# Patient Record
Sex: Male | Born: 1990 | Race: Black or African American | Hispanic: No | Marital: Single | Smoking: Never smoker
Health system: Southern US, Community
[De-identification: ages and names within clinical notes are randomized; demographics above are authoritative.]

---

## 2018-09-07 ENCOUNTER — Emergency Department (HOSPITAL_COMMUNITY)
Admission: EM | Admit: 2018-09-07 | Discharge: 2018-09-07 | Disposition: A | Discharge status: Home / Self Care | Payer: Self-pay | Attending: Emergency Medicine | Admitting: Emergency Medicine

## 2018-09-07 ENCOUNTER — Encounter (HOSPITAL_COMMUNITY): Payer: Self-pay | Admitting: *Deleted

## 2018-09-07 ENCOUNTER — Other Ambulatory Visit: Payer: Self-pay

## 2018-09-07 DIAGNOSIS — R300 Dysuria: Secondary | ICD-10-CM | POA: Insufficient documentation

## 2018-09-07 DIAGNOSIS — R0981 Nasal congestion: Secondary | ICD-10-CM | POA: Insufficient documentation

## 2018-09-07 LAB — URINALYSIS, ROUTINE W REFLEX MICROSCOPIC
Bilirubin Urine: NEGATIVE
Glucose, UA: NEGATIVE mg/dL
Hgb urine dipstick: NEGATIVE
Ketones, ur: NEGATIVE mg/dL
Leukocytes, UA: NEGATIVE
Nitrite: NEGATIVE
Protein, ur: NEGATIVE mg/dL
Specific Gravity, Urine: 1.023 (ref 1.005–1.030)
pH: 6 (ref 5.0–8.0)

## 2018-09-07 LAB — HIV ANTIBODY (ROUTINE TESTING W REFLEX): HIV Screen 4th Generation wRfx: NONREACTIVE

## 2018-09-07 MED ORDER — FLUTICASONE PROPIONATE 50 MCG/ACT NA SUSP: 2.0000 | Freq: Every day | NASAL | 2 refills | Status: DC

## 2018-09-07 MED ORDER — CETIRIZINE HCL 10 MG PO TABS: 10.0000 mg | ORAL_TABLET | Freq: Every day | ORAL | 1 refills | Status: DC

## 2018-09-07 NOTE — Discharge Instructions (Addendum)
There were no abnormalities noted on the urinalysis. Hand washing: Wash your hands throughout the day, but especially before and after touching the face, using the restroom, sneezing, coughing, or touching surfaces that have been coughed or sneezed upon. Hydration: Symptoms will be intensified and complicated by dehydration. Dehydration can also extend the duration of symptoms. Drink plenty of fluids and get plenty of rest. You should be drinking at least half a liter of water an hour to stay hydrated. Electrolyte drinks (ex. Gatorade, Powerade, Pedialyte) are also encouraged. You should be drinking enough fluids to make your urine light yellow, almost clear. If this is not the case, you are not drinking enough water. Please note that some of the treatments indicated below will not be effective if you are not adequately hydrated. Pain or fever: Ibuprofen, Naproxen, or acetaminophen (generic for Tylenol) for pain or fever.  Zyrtec or Claritin: May add these medication daily to control underlying symptoms of congestion, sneezing, and other signs of allergies.  These medications are available over-the-counter. Generics: Cetirizine (generic for Zyrtec) and loratadine (generic for Claritin). Fluticasone: Use fluticasone (generic for Flonase), as directed, for nasal and sinus congestion.  This medication is available over-the-counter. Congestion: Plain guaifenesin (generic for plain Mucinex) may help relieve congestion. Saline sinus rinses and saline nasal sprays may also help relieve congestion. If you do not have high blood pressure, heart problems, or an allergy to such medications, you may also try phenylephrine or Sudafed. Sore throat: Warm liquids or Chloraseptic spray may help soothe a sore throat. Gargle twice a day with a salt water solution made from a half teaspoon of salt in a cup of warm water.  Follow up: Follow up with a primary care provider within the next two weeks should symptoms fail to  resolve. Return: Return to the ED for significantly worsening symptoms, abdominal pain, shortness of breath, persistent vomiting, or any other major concerns.  You have been tested for STDs. Some of these results are still pending. Any abnormalities will be called to you. Be sure to follow safe sex practices, including monogamy and/or condom use. Any future STD testing or treatment should be performed at the [facility] or primary care office. Should symptoms fail to improve within 1 week, follow up with a primary care provider or go to the [facility].  For prescription assistance, may try using prescription discount sites or apps, such as goodrx.com

## 2018-09-07 NOTE — ED Triage Notes (Signed)
The pt wants to be seen for a sinus infection that he has had for one month  He has other problems that he wants to talk to the doctor about
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
## 2018-09-08 LAB — GC/CHLAMYDIA PROBE AMP (~~LOC~~) NOT AT ARMC
Chlamydia: NEGATIVE
NEISSERIA GONORRHEA: NEGATIVE

## 2018-09-08 LAB — URINE CULTURE: Culture: NO GROWTH

## 2018-09-08 LAB — RPR, QUANT+TP ABS (REFLEX)
Rapid Plasma Reagin, Quant: 1:2 {titer} — ABNORMAL HIGH
T Pallidum Abs: REACTIVE — AB

## 2018-09-08 LAB — SYPHILIS: RPR W/REFLEX TO RPR TITER AND TREPONEMAL ANTIBODIES, TRADITIONAL SCREENING AND DIAGNOSIS ALGORITHM: RPR Ser Ql: REACTIVE — AB

## 2019-01-06 ENCOUNTER — Other Ambulatory Visit: Payer: Self-pay

## 2019-01-06 ENCOUNTER — Emergency Department (HOSPITAL_COMMUNITY)
Admission: EM | Admit: 2019-01-06 | Discharge: 2019-01-06 | Disposition: A | Discharge status: Home / Self Care | Payer: Self-pay | Attending: Emergency Medicine | Admitting: Emergency Medicine

## 2019-01-06 DIAGNOSIS — R21 Rash and other nonspecific skin eruption: Secondary | ICD-10-CM | POA: Insufficient documentation

## 2019-01-06 DIAGNOSIS — Z79899 Other long term (current) drug therapy: Secondary | ICD-10-CM | POA: Insufficient documentation

## 2019-01-06 MED ORDER — PERMETHRIN 5 % EX CREA: TOPICAL_CREAM | CUTANEOUS | 0 refills | Status: DC

## 2019-01-06 NOTE — ED Provider Notes (Signed)
TIME SEEN: 1:49 AM  CHIEF COMPLAINT: Rash  HPI: Patient is a 28 year old male with no significant past medical history who presents to the emergency department for a pruritic rash to his extremities and torso over the past several days.  No rash on his palms, soles or mucous membranes.  No fever.  No new exposures.  Has been seen by his PCP as he was concerned this could be related to an STD and states he had a negative syphilis test.  States his doctor told him he would "look into it".  He has not tried any over-the-counter medications.  States it was itching tonight when he was coming home from work so he decided to stop by the emergency department to "get it checked out".  ROS: See HPI Constitutional: no fever  Eyes: no drainage  ENT: no runny nose   Cardiovascular:  no chest pain  Resp: no SOB  GI: no vomiting GU: no dysuria Integumentary: no rash  Allergy: no hives  Musculoskeletal: no leg swelling  Neurological: no slurred speech ROS otherwise negative  PAST MEDICAL HISTORY/PAST SURGICAL HISTORY:  No past medical history on file.  MEDICATIONS:  Prior to Admission medications   Medication Sig Start Date End Date Taking? Authorizing Provider  cetirizine (ZYRTEC ALLERGY) 10 MG tablet Take 1 tablet (10 mg total) by mouth daily. 09/07/18 11/06/18  Joy, Shawn C, PA-C  fluticasone (FLONASE) 50 MCG/ACT nasal spray Place 2 sprays into both nostrils daily. 09/07/18   Joy, Shawn C, PA-C  permethrin (ELIMITE) 5 % cream Apply to affected area once 01/06/19   Janeil Schexnayder, Layla Maw, DO    ALLERGIES:  No Known Allergies  SOCIAL HISTORY:  Social History   Tobacco Use  . Smoking status: Never Smoker  . Smokeless tobacco: Never Used  Substance Use Topics  . Alcohol use: Yes    FAMILY HISTORY: No family history on file.  EXAM: BP 133/85 (BP Location: Right Arm)   Pulse 76   Temp 97.8 F (36.6 C) (Oral)   Resp 16   Ht 6' (1.829 m)   Wt 72.6 kg   SpO2 100%   BMI 21.70 kg/m   CONSTITUTIONAL: Alert and responds appropriately to questions. Well-appearing; well-nourished afebrile, nontoxic, well-hydrated, laughing HEAD: Normocephalic, atraumatic EYES: Conjunctivae clear, pupils appear equal ENT: normal nose; moist mucous membranes NECK: Normal range of motion CARD: Regular rate and rhythm RESP: Normal chest excursion without splinting or tachypnea; no hypoxia or respiratory distress, speaking full sentences ABD/GI: non-distended EXT: Normal ROM in all joints, no major deformities noted SKIN: Normal color for age and race, scattered erythematous papular and in some places ulcerated lesions with scabs to his extremities and torso, no petechia or purpura, no blisters or desquamation, no rash on the palms or soles or mucous membranes, no urticaria, no surrounding warmth or tenderness, no fluctuance or drainage NEURO: Moves all extremities equally, normal speech, no facial asymmetry noted PSYCH: The patient's mood and manner are appropriate. Grooming and personal hygiene are appropriate.  MEDICAL DECISION MAKING: Patient here with nonspecific rash.  He is concerned this could be scabies.  He does work in a nursing home.  Will discharge with permethrin cream.  Have also advised him to use Benadryl over-the-counter and hydrocortisone cream as needed.  I do not feel he needs systemic steroids.  This does not look like an allergic reaction.  None of these areas appear to be infected.  There is no sign of any life-threatening rash today.  Doubt SJS,  TPN, RMSF, other tick related illness.  I feel he is safe to be discharged home.  Recommend close follow-up with PCP and dermatology if symptoms are not improving.   At this time, I do not feel there is any life-threatening condition present. I have reviewed and discussed all results (EKG, imaging, lab, urine as appropriate) and exam findings with patient/family. I have reviewed nursing notes and appropriate previous records.  I feel the  patient is safe to be discharged home without further emergent workup and can continue workup as an outpatient as needed. Discussed usual and customary return precautions. Patient/family verbalize understanding and are comfortable with this plan.  Outpatient follow-up has been provided as needed. All questions have been answered.     Opie Maclaughlin, Layla Maw, DO 01/06/19 [phone]

## 2019-01-06 NOTE — ED Triage Notes (Signed)
Per pt he has places al over his body that are itching. Noticed the ara on his body that are raised. Pt says it started about 1 week ago. 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

## 2019-01-06 NOTE — Discharge Instructions (Signed)
You may use Benadryl 50 mg every 8 hours as needed for itching.  You may use hydrocortisone cream over-the-counter 2-3 times daily as needed.   [facility] Dermatologists:   [facility]  [address]  [phone]  [facility]  [address] [place] [address] [place]  [phone]   Dr. Margo Aye and Dr. Charlton Haws [address]  [phone]  [facility]  [address] [place]  [phone]   [facility] [address]  [phone]

## 2019-02-25 ENCOUNTER — Other Ambulatory Visit: Payer: Self-pay

## 2019-02-25 ENCOUNTER — Emergency Department (HOSPITAL_COMMUNITY)
Admission: EM | Admit: 2019-02-25 | Discharge: 2019-02-25 | Disposition: A | Discharge status: Home / Self Care | Payer: Self-pay | Attending: Emergency Medicine | Admitting: Emergency Medicine

## 2019-02-25 ENCOUNTER — Encounter (HOSPITAL_COMMUNITY): Payer: Self-pay | Admitting: Emergency Medicine

## 2019-02-25 ENCOUNTER — Emergency Department (HOSPITAL_COMMUNITY): Payer: Self-pay

## 2019-02-25 DIAGNOSIS — F1290 Cannabis use, unspecified, uncomplicated: Secondary | ICD-10-CM | POA: Insufficient documentation

## 2019-02-25 DIAGNOSIS — Y999 Unspecified external cause status: Secondary | ICD-10-CM | POA: Insufficient documentation

## 2019-02-25 DIAGNOSIS — Y939 Activity, unspecified: Secondary | ICD-10-CM | POA: Insufficient documentation

## 2019-02-25 DIAGNOSIS — S298XXA Other specified injuries of thorax, initial encounter: Secondary | ICD-10-CM

## 2019-02-25 DIAGNOSIS — S43401A Unspecified sprain of right shoulder joint, initial encounter: Secondary | ICD-10-CM

## 2019-02-25 DIAGNOSIS — Y929 Unspecified place or not applicable: Secondary | ICD-10-CM | POA: Insufficient documentation

## 2019-02-25 MED ORDER — HYDROCODONE-ACETAMINOPHEN 5-325 MG PO TABS: 1.0000 | ORAL_TABLET | Freq: Four times a day (QID) | ORAL | 0 refills | Status: DC | PRN

## 2019-02-25 MED ORDER — IBUPROFEN 400 MG PO TABS
400.0000 mg | ORAL_TABLET | Freq: Once | ORAL | Status: AC
  Administered 2019-02-25: 400 mg via ORAL
  Filled 2019-02-25: qty 1
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
## 2019-07-26 ENCOUNTER — Emergency Department (HOSPITAL_COMMUNITY)
Admission: EM | Admit: 2019-07-26 | Discharge: 2019-07-27 | Disposition: A | Discharge status: Home / Self Care | Payer: Self-pay | Attending: Emergency Medicine | Admitting: Emergency Medicine

## 2019-07-26 ENCOUNTER — Emergency Department (HOSPITAL_COMMUNITY): Payer: Self-pay

## 2019-07-26 ENCOUNTER — Other Ambulatory Visit: Payer: Self-pay

## 2019-07-26 DIAGNOSIS — F1210 Cannabis abuse, uncomplicated: Secondary | ICD-10-CM | POA: Insufficient documentation

## 2019-07-26 DIAGNOSIS — M25561 Pain in right knee: Secondary | ICD-10-CM | POA: Insufficient documentation
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
## 2019-07-27 MED ORDER — KETOROLAC TROMETHAMINE 30 MG/ML IJ SOLN
INTRAMUSCULAR | Status: AC
  Filled 2019-07-27: qty 1

## 2019-07-27 MED ORDER — KETOROLAC TROMETHAMINE 60 MG/2ML IM SOLN
30.0000 mg | Freq: Once | INTRAMUSCULAR | Status: AC
  Administered 2019-07-27: 30 mg via INTRAMUSCULAR

## 2019-07-27 MED ORDER — HYDROCODONE-ACETAMINOPHEN 5-325 MG PO TABS: 1.0000 | ORAL_TABLET | Freq: Four times a day (QID) | ORAL | 0 refills | Status: AC | PRN

## 2019-07-27 MED ORDER — METHOCARBAMOL 500 MG PO TABS: 500.0000 mg | ORAL_TABLET | Freq: Two times a day (BID) | ORAL | 0 refills | Status: AC

## 2019-07-27 NOTE — Discharge Instructions (Addendum)
Thank you for allowing me to care for you today in the Emergency Department.   Wear the knee sleeve on your right knee.  The compression should help with pain control.  Use crutches as needed until you can bear weight on your right foot without significant pain.  Take 650 mg of Tylenol or 600 mg of ibuprofen with food every 6 hours for pain.  You can alternate between these 2 medications every 3 hours if your pain returns.  For instance, you can take Tylenol at noon, followed by a dose of ibuprofen at 3, followed by second dose of Tylenol and 6. For severe, uncontrollable pain, you can take 1 tablet of Norco every 6 hours.  Do not work or drive while taking this medication as it is a narcotic and causes you to be impaired.  Each tablet of Norco contains 325 mg of Tylenol.  Do not take more than 4000 mg of Tylenol from all sources in a 24-hour period.  Do not take other sedating substances such as drinking alcohol or Robaxin, a muscle relaxer, at the same time because it may make you too drowsy.  You can take 1 tablet of Robaxin by mouth up to 2 times daily.  Again, do not take it with Norco or while drinking alcohol or with other sedating substances.  Apply an ice pack to areas that are sore for 15 to 20 minutes as frequently as needed.  Try to elevate your right leg when you are sitting and resting so that your toes are at or above the level of your nose to help with pain.  Call to schedule a follow-up appointment with orthopedics if you continue to have severe pain when the office reopens tomorrow.  Return the emergency department if you have any fall or injury, develop significant redness and warmth to the knee or to the lower leg, if your toes turn blue, or if you develop other new, concerning symptoms.

## 2019-07-27 NOTE — ED Provider Notes (Signed)
[facility] EMERGENCY DEPARTMENT Provider Note   CSN: [id number] Arrival date & time: 07/26/19  2242     History   Chief Complaint Chief Complaint  Patient presents with  . Knee Pain    HPI Clayten Allcock is a 28 y.o. male who presents to the emergency department with a chief complaint of right knee pain.  The patient reports that he was sitting in his car 2 days ago when he felt a cramp in his right upper leg, causing him to jump up.  He states that he heard a pop when he jumped up.  He cannot recall if he hit it on the dashboard or how he landed on it when he jumped up.  He reports that for the last 2 days that he has had significant pain with attempting to weight-bear on his right leg and has been using a set of crutches that his grandmother gave him.  He denies numbness, weakness, additional muscle cramps, right hip or ankle pain.  No previous right knee injury or surgery.  He took 1 tablet of Norco from an old prescription earlier today with some improvement in his pain.  He is not established with orthopedics.     The history is provided by the patient. No language interpreter was used.    No past medical history on file.  There are no active problems to display for this patient.   No past surgical history on file.      Home Medications    Prior to Admission medications   Medication Sig Start Date End Date Taking? Authorizing Provider  HYDROcodone-acetaminophen (NORCO/VICODIN) 5-325 MG tablet Take 1 tablet by mouth every 6 (six) hours as needed. 07/27/19   Aris Even A, PA-C  methocarbamol (ROBAXIN) 500 MG tablet Take 1 tablet (500 mg total) by mouth 2 (two) times daily. 07/27/19   Champ Keetch A, PA-C    Family History No family history on file.  Social History Social History   Tobacco Use  . Smoking status: Never Smoker  . Smokeless tobacco: Never Used  Substance Use Topics  . Alcohol use: Yes  . Drug use: Yes    Types: Marijuana     Allergies   Patient has no known allergies.   Review of Systems Review of Systems  Constitutional: Negative for activity change, chills and fever.  Respiratory: Negative for shortness of breath.   Cardiovascular: Negative for chest pain.  Gastrointestinal: Negative for abdominal pain.  Musculoskeletal: Positive for arthralgias, gait problem and myalgias. Negative for back pain, joint swelling, neck pain and neck stiffness.  Skin: Negative for color change, rash and wound.  Neurological: Negative for weakness and numbness.     Physical Exam Updated Vital Signs BP 134/85 (BP Location: Left Arm)   Pulse 76   Temp 98.1 F (36.7 C) (Oral)   Resp 16   SpO2 98%   Physical Exam Vitals signs and nursing note reviewed.  Constitutional:      General: He is not in acute distress.    Appearance: He is well-developed. He is not ill-appearing, toxic-appearing or diaphoretic.  HENT:     Head: Normocephalic.  Eyes:     Conjunctiva/sclera: Conjunctivae normal.  Neck:     Musculoskeletal: Neck supple.  Cardiovascular:     Rate and Rhythm: Normal rate and regular rhythm.     Heart sounds: No murmur.  Pulmonary:     Effort: Pulmonary effort is normal.  Abdominal:     General:  There is no distension.     Palpations: Abdomen is soft.  Musculoskeletal:        General: Tenderness present. No swelling.     Right lower leg: No edema.     Left lower leg: No edema.     Comments: No overlying redness, warmth, or swelling to the right knee.  Tender to palpation over the lateral joint line of the right knee.  No medial joint line tenderness.  No tenderness over the patellar or quadriceps tendon.  Patient is unable to actively range of motion the knee secondary to pain.  Increased pain with flexion and extension of the right knee with passive range of motion.  Negative anterior and posterior drawer test.  Normal exam of the right hip and ankle.  He is neurovascularly intact to the bilateral lower  extremities.  Skin:    General: Skin is warm and dry.  Neurological:     Mental Status: He is alert.  Psychiatric:        Behavior: Behavior normal.      ED Treatments / Results  Labs (all labs ordered are listed, but only abnormal results are displayed) Labs Reviewed - No data to display  EKG None  Radiology Dg Knee 2 Views Right  Result Date: 07/26/2019 CLINICAL DATA:  28 year old male with trauma to the right knee. EXAM: RIGHT KNEE - 1-2 VIEW COMPARISON:  None. FINDINGS: There is no acute fracture or dislocation. The bones are well mineralized. No arthritic change. Small suprapatellar effusion. The soft tissues are unremarkable. IMPRESSION: No acute fracture or dislocation. Electronically Signed   By: Anner Crete M.D.   On: 07/26/2019 23:28    Procedures Procedures (including critical care time)  Medications Ordered in ED Medications  ketorolac (TORADOL) injection 30 mg (30 mg Intramuscular Given 07/27/19 0105)     Initial Impression / Assessment and Plan / ED Course  I have reviewed the triage vital signs and the nursing notes.  Pertinent labs & imaging results that were available during my care of the patient were reviewed by me and considered in my medical decision making (see chart for details).        28 year old male presenting with acute right knee pain after he had a muscle cramp in his leg and jumped up and heard a pop in his knee 2 days ago.  He has had pain with weightbearing has been using crutches since the incident.  He has neurovascularly intact and has a normal exam of the right hip and ankle.  On exam, he is maximally tender over the lateral joint line of the right knee.  X-ray is negative for fracture or acute injury.  Doubt gout or septic joint.  There is a small suprapatellar effusion.  I suspect he may have a meniscus tear versus LCL tear based on his pain.  Offer the patient a knee immobilizer, but he declined and was agreeable to a knee  sleeve.  He already has a set of crutches, but I will ensure that they are fit for his height.  Toradol given for pain control.  Will give a referral to orthopedics and discharged with pain control.  ER return precautions given.  He is hemodynamically stable in no acute distress.  Safe for discharge home with outpatient follow-up as indicated.  Final Clinical Impressions(s) / ED Diagnoses   Final diagnoses:  Acute pain of right knee    ED Discharge Orders         Ordered  HYDROcodone-acetaminophen (NORCO/VICODIN) 5-325 MG tablet  Every 6 hours PRN     07/27/19 0102    methocarbamol (ROBAXIN) 500 MG tablet  2 times daily     07/27/19 0102           Vamsi Apfel, Pedro EarlsMia A, PA-C 07/27/19 0116    Nira Connardama, Pedro Eduardo, MD 07/27/19 [id number]

## 2019-09-21 ENCOUNTER — Other Ambulatory Visit: Payer: Self-pay

## 2019-09-21 DIAGNOSIS — Z20822 Contact with and (suspected) exposure to covid-19: Secondary | ICD-10-CM

## 2019-09-23 LAB — NOVEL CORONAVIRUS, NAA: SARS-CoV-2, NAA: DETECTED — AB

## 2019-09-24 ENCOUNTER — Telehealth: Payer: Self-pay

## 2019-09-24 NOTE — Telephone Encounter (Signed)
Pt notified of positive COVID-19 test results. Pt verbalized understanding. Pt reports that he has body aches. Pt advised to remain in self quarantine until at least 10 days since symptom onset And 3 consecutive days fever free without antipyretics And improvement in respiratory symptoms. Patient advised to utilize over the counter medications to treat symptoms. Pt advised to seek treatment in the ED if respiratory issues/distress develops.Pt advised they should only leave home to seek and medical care and must wear a mask in public. Pt instructed to limit contact with family members or caregivers in the home. Pt advised to practice social distancing and to continue to use good preventative care measures such has frequent hand washing, staying out of crowds and cleaning hard surfaces frequently touched in the home.Pt informed that the health department will likely follow up and may have additional recommendations. Will notify [facility] Department.

## 2020-06-02 ENCOUNTER — Emergency Department (HOSPITAL_COMMUNITY): Payer: Self-pay

## 2020-06-02 ENCOUNTER — Encounter (HOSPITAL_COMMUNITY): Payer: Self-pay | Admitting: *Deleted

## 2020-06-02 ENCOUNTER — Other Ambulatory Visit: Payer: Self-pay

## 2020-06-02 ENCOUNTER — Emergency Department (HOSPITAL_COMMUNITY)
Admission: EM | Admit: 2020-06-02 | Discharge: 2020-06-02 | Disposition: A | Discharge status: Home / Self Care | Payer: Self-pay | Attending: Emergency Medicine | Admitting: Emergency Medicine

## 2020-06-02 DIAGNOSIS — Z79899 Other long term (current) drug therapy: Secondary | ICD-10-CM | POA: Insufficient documentation

## 2020-06-02 DIAGNOSIS — M25561 Pain in right knee: Secondary | ICD-10-CM | POA: Insufficient documentation

## 2020-06-02 DIAGNOSIS — K611 Rectal abscess: Secondary | ICD-10-CM | POA: Insufficient documentation

## 2020-06-02 DIAGNOSIS — L0291 Cutaneous abscess, unspecified: Secondary | ICD-10-CM

## 2020-06-02 DIAGNOSIS — M25569 Pain in unspecified knee: Secondary | ICD-10-CM

## 2020-06-02 LAB — CBC WITH DIFFERENTIAL/PLATELET
Abs Immature Granulocytes: 0.02 10*3/uL (ref 0.00–0.07)
Basophils Absolute: 0.1 10*3/uL (ref 0.0–0.1)
Basophils Relative: 1 %
Eosinophils Absolute: 0.2 10*3/uL (ref 0.0–0.5)
Eosinophils Relative: 1 %
HCT: 49 % (ref 39.0–52.0)
Hemoglobin: 14.6 g/dL (ref 13.0–17.0)
Immature Granulocytes: 0 %
Lymphocytes Relative: 26 %
Lymphs Abs: 3.1 10*3/uL (ref 0.7–4.0)
MCH: 25.3 pg — ABNORMAL LOW (ref 26.0–34.0)
MCHC: 29.8 g/dL — ABNORMAL LOW (ref 30.0–36.0)
MCV: 84.8 fL (ref 80.0–100.0)
Monocytes Absolute: 1 10*3/uL (ref 0.1–1.0)
Monocytes Relative: 9 %
Neutro Abs: 7.4 10*3/uL (ref 1.7–7.7)
Neutrophils Relative %: 63 %
Platelets: 189 10*3/uL (ref 150–400)
RBC: 5.78 MIL/uL (ref 4.22–5.81)
RDW: 14.5 % (ref 11.5–15.5)
WBC: 11.7 10*3/uL — ABNORMAL HIGH (ref 4.0–10.5)
nRBC: 0 % (ref 0.0–0.2)

## 2020-06-02 LAB — COMPREHENSIVE METABOLIC PANEL
ALT: 13 U/L (ref 0–44)
AST: 24 U/L (ref 15–41)
Albumin: 4.2 g/dL (ref 3.5–5.0)
Alkaline Phosphatase: 53 U/L (ref 38–126)
Anion gap: 10 (ref 5–15)
BUN: 11 mg/dL (ref 6–20)
CO2: 23 mmol/L (ref 22–32)
Calcium: 9.1 mg/dL (ref 8.9–10.3)
Chloride: 104 mmol/L (ref 98–111)
Creatinine, Ser: 1.05 mg/dL (ref 0.61–1.24)
GFR calc Af Amer: >60 mL/min (ref >60)
GFR calc non Af Amer: >60 mL/min (ref >60)
Glucose, Bld: 89 mg/dL (ref 70–99)
Potassium: 4.3 mmol/L (ref 3.5–5.1)
Sodium: 137 mmol/L (ref 135–145)
Total Bilirubin: 0.6 mg/dL (ref 0.3–1.2)
Total Protein: 7 g/dL (ref 6.5–8.1)

## 2020-06-02 MED ORDER — DOXYCYCLINE HYCLATE 100 MG PO CAPS: 100.0000 mg | ORAL_CAPSULE | Freq: Two times a day (BID) | ORAL | 0 refills | Status: AC

## 2020-06-02 MED ORDER — LIDOCAINE-EPINEPHRINE 1 %-1:100000 IJ SOLN
20.0000 mL | Freq: Once | INTRAMUSCULAR | Status: AC
  Administered 2020-06-02: 20 mL
  Filled 2020-06-02: qty 1

## 2020-06-02 MED ORDER — ONDANSETRON HCL 4 MG/2ML IJ SOLN
4.0000 mg | Freq: Once | INTRAMUSCULAR | Status: AC
  Administered 2020-06-02: 4 mg via INTRAVENOUS
  Filled 2020-06-02: qty 2

## 2020-06-02 MED ORDER — HYDROMORPHONE HCL 1 MG/ML IJ SOLN
1.0000 mg | Freq: Once | INTRAMUSCULAR | Status: AC
  Administered 2020-06-02: 1 mg via INTRAVENOUS
  Filled 2020-06-02: qty 1

## 2020-06-02 MED ORDER — IOHEXOL 300 MG/ML  SOLN
100.0000 mL | Freq: Once | INTRAMUSCULAR | Status: AC | PRN
  Administered 2020-06-02: 100 mL via INTRAVENOUS

## 2020-06-02 MED ORDER — IBUPROFEN 800 MG PO TABS: 800.0000 mg | ORAL_TABLET | Freq: Three times a day (TID) | ORAL | 0 refills | Status: AC | PRN

## 2020-06-02 MED ORDER — LORAZEPAM 2 MG/ML IJ SOLN
0.5000 mg | Freq: Once | INTRAMUSCULAR | Status: AC
  Administered 2020-06-02: 0.5 mg via INTRAVENOUS
  Filled 2020-06-02: qty 1

## 2020-06-02 NOTE — Discharge Instructions (Signed)
Follow-up Sunday or Monday for recheck of your infection.  Clean area twice a day with soap and water.  Follow-up with Dr. Everardo Pacific within the next week

## 2020-06-02 NOTE — ED Notes (Signed)
Patient verbalizes understanding of discharge instructions. Opportunity for questioning and answers were provided. Armband removed by staff. Patient discharged from ED. Signature pad not working  
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
## 2021-11-14 IMAGING — CT CT ABD-PELV W/ CM
2 of 5 series · 15 of 46 positions shown, 17 images · IV contrast (Omni 300)
Comparison: None.

CLINICAL DATA: Abdominal abscess/infection suspected

EXAM:
CT ABDOMEN AND PELVIS WITH CONTRAST
TECHNIQUE: Multidetector CT imaging of the abdomen and pelvis was performed
using the standard protocol following bolus administration of
intravenous contrast.
CONTRAST:  100mL OMNIPAQUE IOHEXOL 300 MG/ML  SOLN

[Series 3: a/p w/ 5mm · axial · 0.63mm/px · z∈[+896,+1276]mm · 12 of 86 slices shown, 14 images]
[im 5/86  soft-tissue]
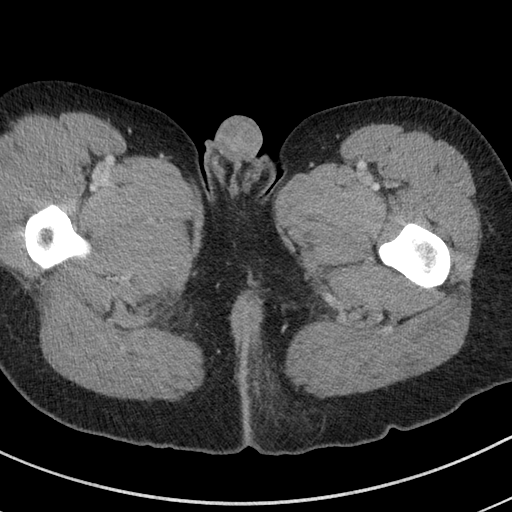
[im 5/86  bone]
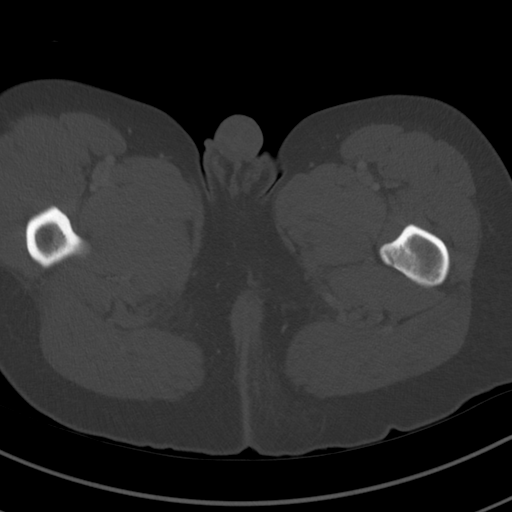
[im 15/86  soft-tissue]
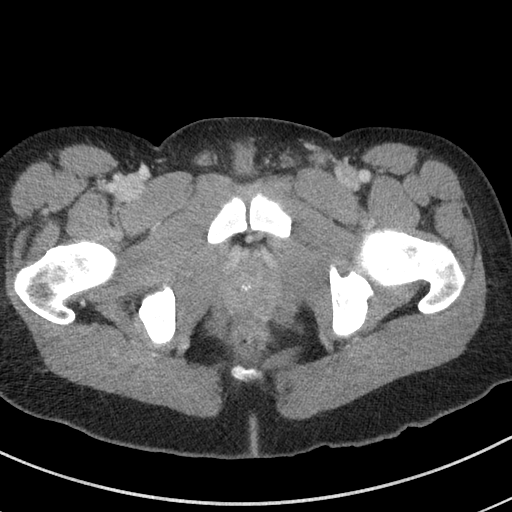
[im 19/86  soft-tissue]
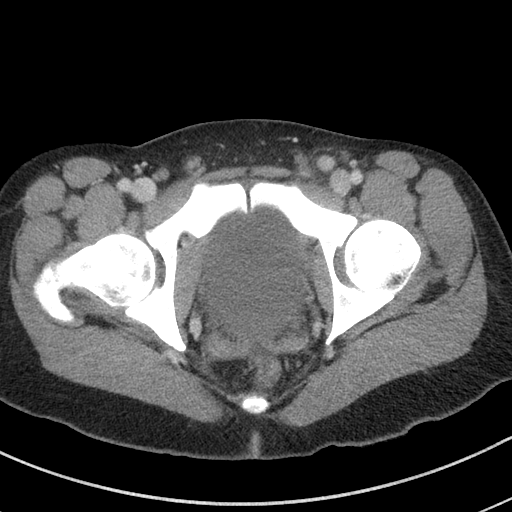
[im 24/86  soft-tissue]
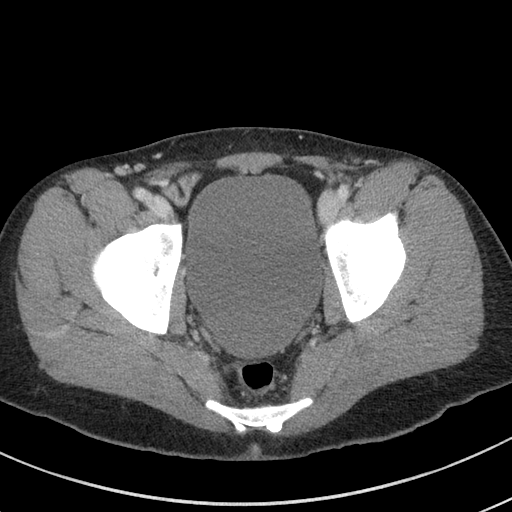
[im 34/86  soft-tissue]
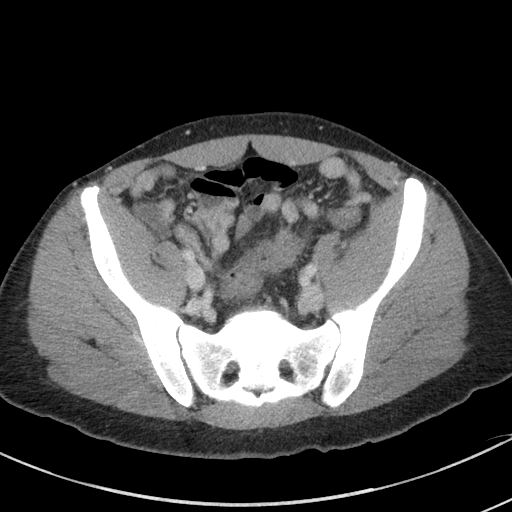
[im 38/86  soft-tissue]
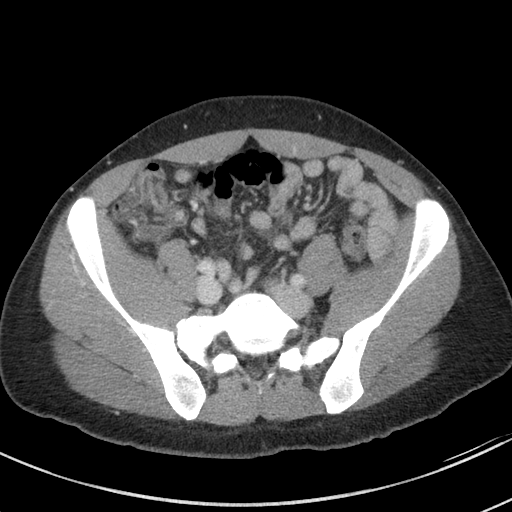
[im 48/86  soft-tissue]
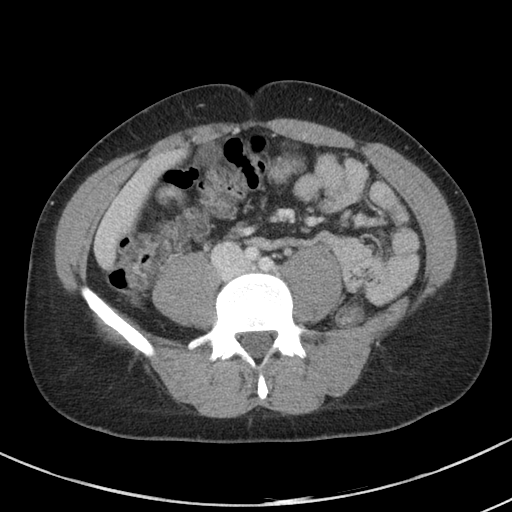
[im 52/86  soft-tissue]
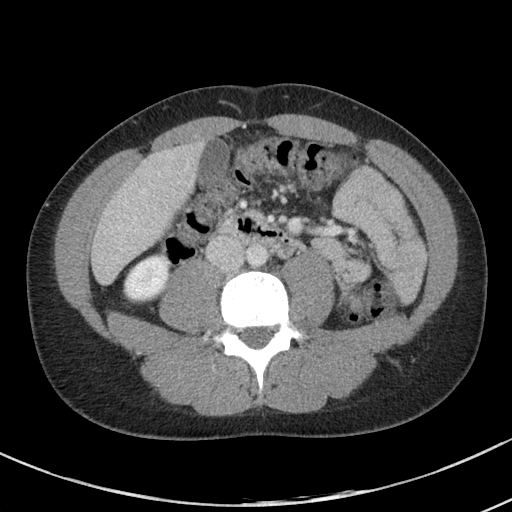
[im 62/86  soft-tissue]
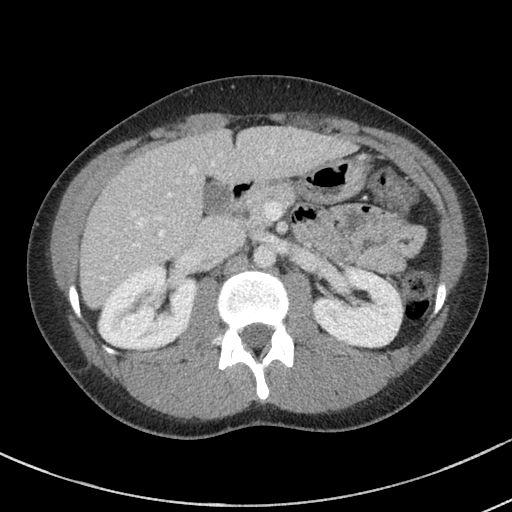
[im 62/86  bone]
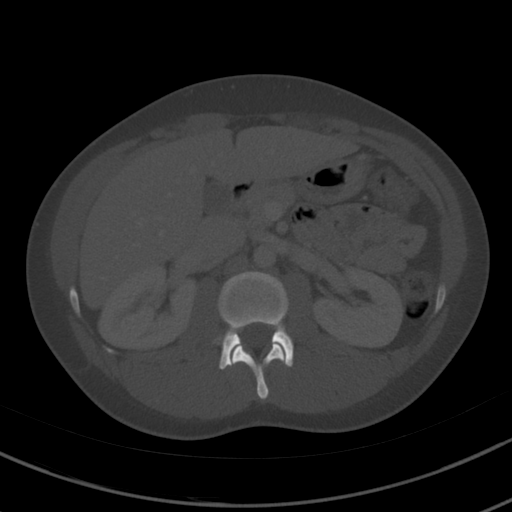
[im 67/86  soft-tissue]
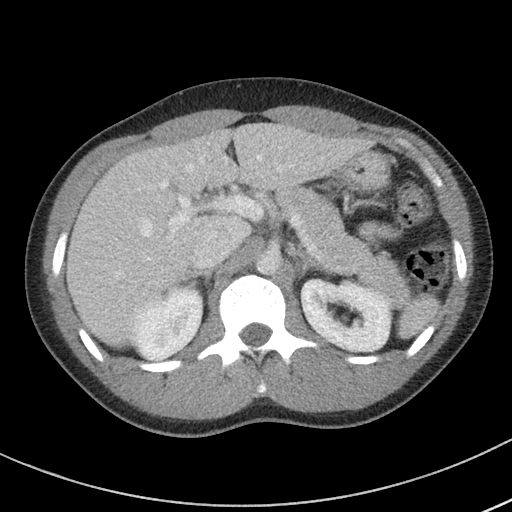
[im 71/86  soft-tissue]
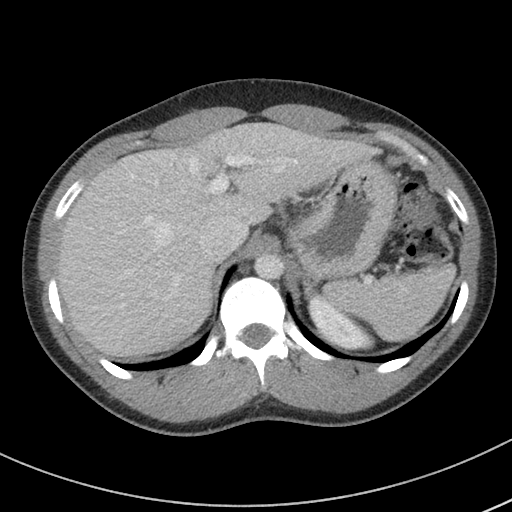
[im 81/86  soft-tissue]
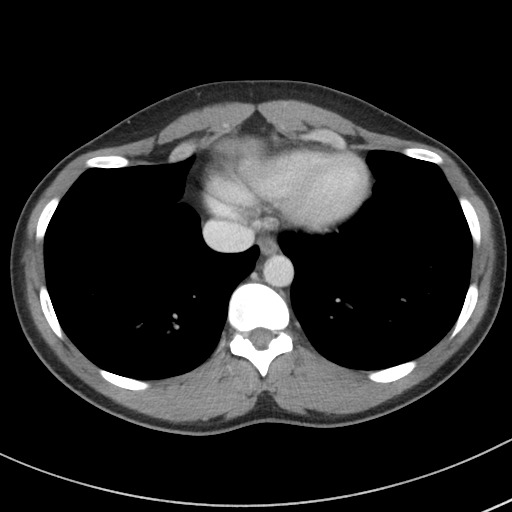

[Series 6: a/p w/ cor · coronal · 0.64mm/px · 3 of 125 slices shown]
[im 42/125  soft-tissue]
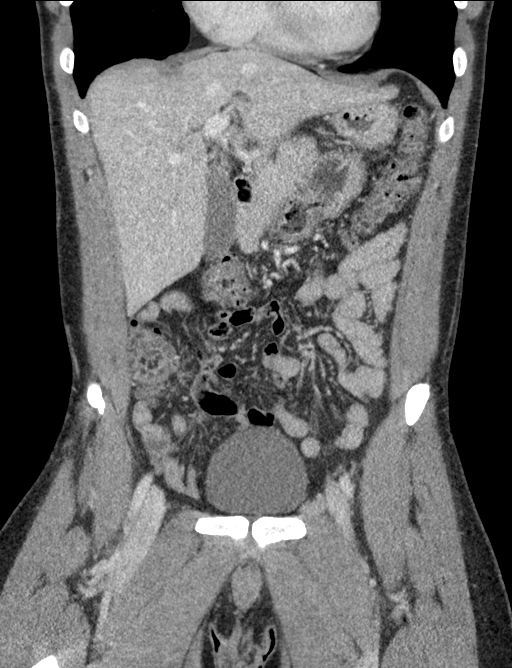
[im 56/125  soft-tissue]
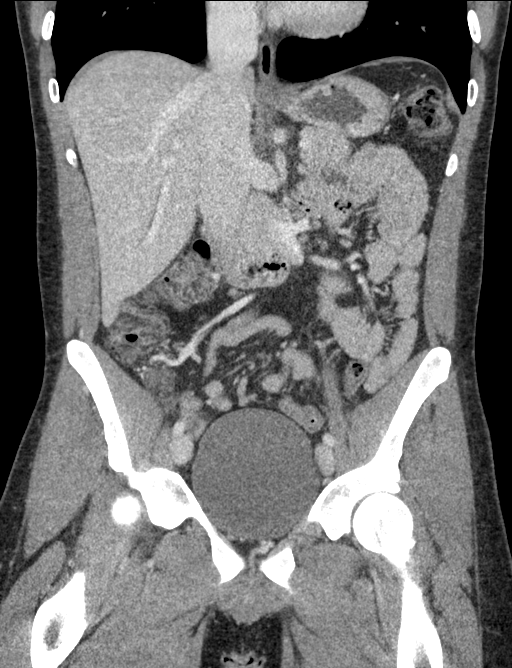
[im 69/125  soft-tissue]
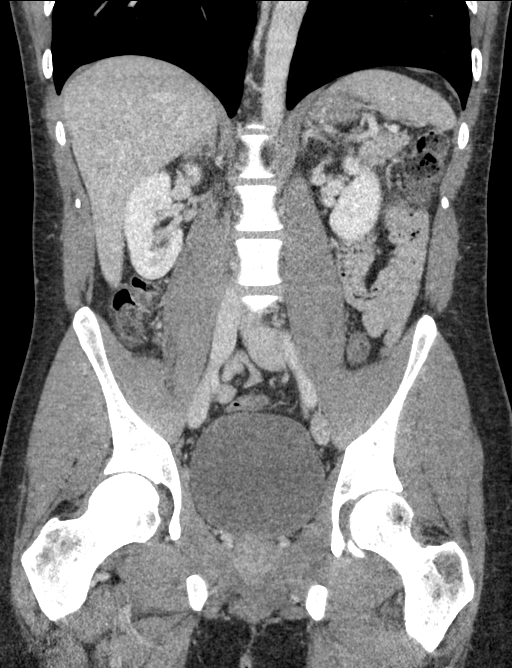

[15 of 46 positions shown; findings below may reference images not displayed]

FINDINGS: Lower chest: Lung bases are clear.

Hepatobiliary: No focal liver abnormality is seen. No gallstones,
gallbladder wall thickening, or biliary dilatation.

Pancreas: No ductal dilatation or inflammation.

Spleen: Normal in size without focal abnormality.

Adrenals/Urinary Tract: Normal adrenal glands. No hydronephrosis or
perinephric edema. Homogeneous renal enhancement. Urinary bladder is
physiologically distended without wall thickening.

Stomach/Bowel: Bowel evaluation is limited in the absence of enteric
contrast. Stomach is nondistended, unremarkable. Normal positioning
of the duodenum and ligament of Treitz. There is no small bowel
obstruction or inflammation. Appendix not definitively visualized,
no evidence of appendicitis. Equivocal wall thickening versus
nondistention of the ascending and transverse colon. There is no
pericolonic edema.

Vascular/Lymphatic: Normal caliber abdominal aorta. Patent portal
vein. No acute vascular findings. 14 mm short axis left inguinal
node, likely reactive. No other enlarged lymph nodes in the abdomen
or pelvis.

Reproductive: Prostate is unremarkable.

Other: Fluid collection involving the inferior left gluteal crease
measures 16 x 9 x 17 mm with surrounding skin thickening and fat
stranding. Inferior aspect is not included in the field of view.
There is no internal air. No connection to the anorectum. No
intra-abdominal or pelvic abscess or fluid collection. No ascites.
No free air. Tiny fat containing umbilical hernia.

Musculoskeletal: There are no acute or suspicious osseous
abnormalities. Hemi transitional lumbosacral anatomy.
IMPRESSION: 1. Fluid collection involving the inferior left gluteal crease
measures 16 x 9 x 17 mm with surrounding skin thickening and fat
stranding, consistent with abscess. Inferior aspect is not included
in the field of view. No connection to the anorectum.
2. Equivocal wall thickening versus nondistention of the ascending
and transverse colon.
3. Mildly enlarged left inguinal node is likely reactive.
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
